# Patient Record
Sex: Male | Born: 1995 | Race: Black or African American | Hispanic: No | Marital: Single | Smoking: Never smoker
Health system: Southern US, Community
[De-identification: ages and names within clinical notes are randomized; demographics above are authoritative.]

## PROBLEM LIST (undated history)

## (undated) ENCOUNTER — Emergency Department (HOSPITAL_BASED_OUTPATIENT_CLINIC_OR_DEPARTMENT_OTHER): Admission: EM | Payer: Medicaid Other | Source: Home / Self Care

---

## 2014-07-04 ENCOUNTER — Other Ambulatory Visit: Payer: Self-pay | Admitting: Orthopedic Surgery

## 2014-07-04 DIAGNOSIS — M25562 Pain in left knee: Secondary | ICD-10-CM

## 2014-07-05 ENCOUNTER — Ambulatory Visit
Admission: RE | Admit: 2014-07-05 | Discharge: 2014-07-05 | Disposition: A | Discharge status: Home / Self Care | Payer: PRIVATE HEALTH INSURANCE | Source: Ambulatory Visit | Attending: Orthopedic Surgery | Admitting: Orthopedic Surgery

## 2014-07-05 DIAGNOSIS — M25562 Pain in left knee: Secondary | ICD-10-CM

## 2014-10-14 ENCOUNTER — Emergency Department (HOSPITAL_BASED_OUTPATIENT_CLINIC_OR_DEPARTMENT_OTHER)
Admission: EM | Admit: 2014-10-14 | Discharge: 2014-10-14 | Disposition: A | Discharge status: Home / Self Care | Payer: Medicaid Other | Attending: Emergency Medicine | Admitting: Emergency Medicine

## 2014-10-14 ENCOUNTER — Encounter (HOSPITAL_BASED_OUTPATIENT_CLINIC_OR_DEPARTMENT_OTHER): Payer: Self-pay | Admitting: Emergency Medicine

## 2014-10-14 DIAGNOSIS — Z79899 Other long term (current) drug therapy: Secondary | ICD-10-CM | POA: Insufficient documentation

## 2014-10-14 DIAGNOSIS — A64 Unspecified sexually transmitted disease: Secondary | ICD-10-CM | POA: Diagnosis not present

## 2014-10-14 DIAGNOSIS — Z202 Contact with and (suspected) exposure to infections with a predominantly sexual mode of transmission: Secondary | ICD-10-CM | POA: Diagnosis present

## 2014-10-14 DIAGNOSIS — Z113 Encounter for screening for infections with a predominantly sexual mode of transmission: Secondary | ICD-10-CM

## 2014-10-14 NOTE — ED Notes (Signed)
Patient states that his friend had and STD so he thought he would come and get checked. Patient has not been exposed nor does he have and reported S/S however he has never been test and states "I thought I should get checked"

## 2014-10-14 NOTE — ED Provider Notes (Signed)
CSN: [id number]     Arrival date & time 10/14/14  1453 History   First MD Initiated Contact with Patient 10/14/14 1505     Chief Complaint  Patient presents with  . SEXUALLY TRANSMITTED DISEASE     (Consider location/radiation/quality/duration/timing/severity/associated sxs/prior Treatment) HPI Comments: Patient is an 18 year old male who presents to the ED for STD check. Patient reports one of his friends was recently diagnosed with an STD so he thought he should get checked. Patient reports unprotected sexual encounters recently. Patient denies any symptoms.    History reviewed. No pertinent past medical history. History reviewed. No pertinent past surgical history. History reviewed. No pertinent family history. History  Substance Use Topics  . Smoking status: Never Smoker   . Smokeless tobacco: Not on file  . Alcohol Use: No    Review of Systems  Constitutional:       STD check  All other systems reviewed and are negative.     Allergies  Review of patient's allergies indicates no known allergies.  Home Medications   Prior to Admission medications   Medication Sig Start Date End Date Taking? Authorizing Provider  methylphenidate (RITALIN LA) 10 MG 24 hr capsule Take 10 mg by mouth daily.   Yes Historical Provider, MD   BP 142/40  Pulse 48  Temp(Src) 98.4 F (36.9 C) (Oral)  Resp 16  Ht 6\' 8"  (2.032 m)  Wt 197 lb (89.359 kg)  BMI 21.64 kg/m2  SpO2 100% Physical Exam  Nursing note and vitals reviewed. Constitutional: He appears well-developed and well-nourished. No distress.  HENT:  Head: Normocephalic and atraumatic.  Eyes: Conjunctivae are normal.  Neck: Normal range of motion.  Cardiovascular: Normal rate and regular rhythm.  Exam reveals no gallop and no friction rub.   No murmur heard. Pulmonary/Chest: Effort normal and breath sounds normal. He has no wheezes. He has no rales. He exhibits no tenderness.  Abdominal: Soft. There is no tenderness.   Genitourinary: Penis normal.  No penile discharge noted.   Musculoskeletal: Normal range of motion.  Neurological: He is alert.  Speech is goal-oriented. Moves limbs without ataxia.   Skin: Skin is warm and dry.  Psychiatric: He has a normal mood and affect. His behavior is normal.    ED Course  Procedures (including critical care time) Labs Review Labs Reviewed - No data to display  Imaging Review No results found.   EKG Interpretation None      MDM   Final diagnoses:  Screening examination for STD (sexually transmitted disease)    4:05 PM Patient swabbed and will be contacted if results positive. No further evaluation needed at this time.   Emilia BeckKaitlyn Anushri Casalino, PA-C 10/14/14 1616  Rolland PorterMark Kippy, MD 10/20/14 [phone]

## 2014-10-14 NOTE — Discharge Instructions (Signed)
You will be contacted in 48 hours if your results are positive. Refer to attached documents for more information.

## 2014-10-16 ENCOUNTER — Telehealth (HOSPITAL_COMMUNITY): Payer: Self-pay | Admitting: *Deleted

## 2014-10-16 ENCOUNTER — Telehealth (HOSPITAL_BASED_OUTPATIENT_CLINIC_OR_DEPARTMENT_OTHER): Payer: Self-pay | Admitting: Emergency Medicine

## 2014-10-16 NOTE — ED Notes (Signed)
Pt. called [facility] for his lab results from 10/31.  I accessed his chart and told him he did not have any tests pending that I can see. I then noted he was seen at [facility] and gave him that number to call. Vassie MoselleYork, Yazan Gatling M 10/16/2014

## 2014-10-19 ENCOUNTER — Emergency Department (HOSPITAL_BASED_OUTPATIENT_CLINIC_OR_DEPARTMENT_OTHER)
Admission: EM | Admit: 2014-10-19 | Discharge: 2014-10-19 | Discharge status: Against Medical Advice | Payer: Medicaid Other

## 2014-10-20 LAB — GC/CHLAMYDIA PROBE AMP
CT Probe RNA: POSITIVE — AB
GC PROBE AMP APTIMA: NEGATIVE

## 2014-10-23 ENCOUNTER — Telehealth (HOSPITAL_COMMUNITY): Payer: Self-pay

## 2014-10-23 NOTE — ED Notes (Signed)
Informed of lab results. Advised to notify partner(s) and abstain from sexual activity x 10 days. Cefixime 400mg  po x 1 and zithromycin 1 gram x 1 per Blondell RevealM Gentry [facility] [address]  [phone]. Left on voicemail.

## 2014-10-23 NOTE — ED Notes (Signed)
Chart has returned from EDP office. Attempted call x 1. 

## 2016-02-09 ENCOUNTER — Ambulatory Visit (INDEPENDENT_AMBULATORY_CARE_PROVIDER_SITE_OTHER): Payer: Commercial Managed Care - PPO | Admitting: Urgent Care

## 2016-02-09 VITALS — BP 110/64 | HR 70 | Temp 98.3°F | Resp 12 | Ht >= 80 in | Wt 196.0 lb

## 2016-02-09 DIAGNOSIS — R112 Nausea with vomiting, unspecified: Secondary | ICD-10-CM | POA: Diagnosis not present

## 2016-02-09 DIAGNOSIS — K30 Functional dyspepsia: Secondary | ICD-10-CM | POA: Diagnosis not present

## 2016-02-09 DIAGNOSIS — K529 Noninfective gastroenteritis and colitis, unspecified: Secondary | ICD-10-CM | POA: Diagnosis not present

## 2016-02-09 DIAGNOSIS — E860 Dehydration: Secondary | ICD-10-CM | POA: Diagnosis not present

## 2016-02-09 DIAGNOSIS — R109 Unspecified abdominal pain: Secondary | ICD-10-CM

## 2016-02-09 MED ORDER — ONDANSETRON 4 MG PO TBDP: 4.0000 mg | ORAL_TABLET | Freq: Three times a day (TID) | ORAL | Status: AC | PRN

## 2016-02-09 MED ORDER — ONDANSETRON 4 MG PO TBDP
4.0000 mg | ORAL_TABLET | Freq: Once | ORAL | Status: AC
  Administered 2016-02-09: 4 mg via ORAL

## 2016-02-09 NOTE — Progress Notes (Signed)
    MRN: [id number] DOB: [birth year]  Subjective:   Harry Adams is a 20 y.o. male presenting for chief complaint of Emesis; Diarrhea; and Abdominal Pain  Reports 1 day history of nausea with vomiting x4. Patient has tried to stay hydrated but keeps throwing up water that he drinks. He has also had belly pain, chills, subjective fever. Has tried Pepto Bismol and ginger ale with minimal relief. Of note, patient is on a basketball team for [employer], multiple teammates have had similar symptoms and are getting treatment for gastroenteritis. Patient also has a Designer, jewellery and plans on playing. Denies smoking cigarettes or drinking alcohol.  Harry Adams currently has no medications in their medication list. Also is allergic to shellfish allergy.  Harry Adams  has no past medical history on file. Also  has no past surgical history on file.  Family history is negative for heart disease, lung disease.  Objective:   Vitals: BP 110/64 mmHg  Pulse 70  Temp(Src) 98.3 F (36.8 C) (Oral)  Resp 12  Ht  (2.057 m)  Wt 196 lb (88.905 kg)  BMI 21.01 kg/m2  SpO2 98%  Orthostatic VS for the past 24 hrs:  BP- Lying Pulse- Lying BP- Sitting Pulse- Sitting BP- Standing at 0 minutes Pulse- Standing at 0 minutes  02/09/16 1153 111/69 mmHg 52 108/67 mmHg 58 129/73 mmHg 69  02/09/16 1042 110/67 mmHg (!) 47 115/72 mmHg (!) 48 107/68 mmHg 93   Physical Exam  Constitutional: He is oriented to person, place, and time. He appears well-developed and well-nourished.  HENT:  Mucous membranes are dry.  Eyes: No scleral icterus.  Cardiovascular: Normal rate, regular rhythm and intact distal pulses.  Exam reveals no gallop and no friction rub.   No murmur heard. Pulmonary/Chest: No respiratory distress. He has no wheezes. He has no rales.  Abdominal: Soft. Bowel sounds are normal. He exhibits no distension and no mass. There is no tenderness.  Neurological: He is alert and oriented to person, place, and time.  Skin: Skin  is warm and dry.   Assessment and Plan :   1. Gastroenteritis 2. Nausea and vomiting, intractability of vomiting not specified, unspecified vomiting type 3. Belly pain 4. Dehydration - Advised supportive care. Anticipatory guidance provided. RTC if no improvement in 1 week.  Wallis Bamberg, PA-C [facility] [phone] 02/09/2016 10:32 AM

## 2016-02-09 NOTE — Patient Instructions (Addendum)
I recommend patient rest and hydrate well, take 2-3 days off from basketball.   Viral Gastroenteritis Viral gastroenteritis is also known as stomach flu. This condition affects the stomach and intestinal tract. It can cause sudden diarrhea and vomiting. The illness typically lasts 3 to 8 days. Most people develop an immune response that eventually gets rid of the virus. While this natural response develops, the virus can make you quite ill. CAUSES  Many different viruses can cause gastroenteritis, such as rotavirus or noroviruses. You can catch one of these viruses by consuming contaminated food or water. You may also catch a virus by sharing utensils or other personal items with an infected person or by touching a contaminated surface. SYMPTOMS  The most common symptoms are diarrhea and vomiting. These problems can cause a severe loss of body fluids (dehydration) and a body salt (electrolyte) imbalance. Other symptoms may include:  Fever.  Headache.  Fatigue.  Abdominal pain. DIAGNOSIS  Your caregiver can usually diagnose viral gastroenteritis based on your symptoms and a physical exam. A stool sample may also be taken to test for the presence of viruses or other infections. TREATMENT  This illness typically goes away on its own. Treatments are aimed at rehydration. The most serious cases of viral gastroenteritis involve vomiting so severely that you are not able to keep fluids down. In these cases, fluids must be given through an intravenous line (IV). HOME CARE INSTRUCTIONS   Drink enough fluids to keep your urine clear or pale yellow. Drink small amounts of fluids frequently and increase the amounts as tolerated.  Ask your caregiver for specific rehydration instructions.  Avoid:  Foods high in sugar.  Alcohol.  Carbonated drinks.  Tobacco.  Juice.  Caffeine drinks.  Extremely hot or cold fluids.  Fatty, greasy foods.  Too much intake of anything at one time.  Dairy  products until 24 to 48 hours after diarrhea stops.  You may consume probiotics. Probiotics are active cultures of beneficial bacteria. They may lessen the amount and number of diarrheal stools in adults. Probiotics can be found in yogurt with active cultures and in supplements.  Wash your hands well to avoid spreading the virus.  Only take over-the-counter or prescription medicines for pain, discomfort, or fever as directed by your caregiver. Do not give aspirin to children. Antidiarrheal medicines are not recommended.  Ask your caregiver if you should continue to take your regular prescribed and over-the-counter medicines.  Keep all follow-up appointments as directed by your caregiver. SEEK IMMEDIATE MEDICAL CARE IF:   You are unable to keep fluids down.  You do not urinate at least once every 6 to 8 hours.  You develop shortness of breath.  You notice blood in your stool or vomit. This may look like coffee grounds.  You have abdominal pain that increases or is concentrated in one small area (localized).  You have persistent vomiting or diarrhea.  You have a fever.  The patient is a child younger than 3 months, and he or she has a fever.  The patient is a child older than 3 months, and he or she has a fever and persistent symptoms.  The patient is a child older than 3 months, and he or she has a fever and symptoms suddenly get worse.  The patient is a baby, and he or she has no tears when crying. MAKE SURE YOU:   Understand these instructions.  Will watch your condition.  Will get help right away if you are  not doing well or get worse.   This information is not intended to replace advice given to you by your health care provider. Make sure you discuss any questions you have with your health care provider.   Document Released: 12/01/2005 Document Revised: 02/23/2012 Document Reviewed: 09/17/2011 Elsevier Interactive Patient Education [employer]

## 2017-09-26 ENCOUNTER — Ambulatory Visit (INDEPENDENT_AMBULATORY_CARE_PROVIDER_SITE_OTHER): Payer: Self-pay | Admitting: Orthopedic Surgery

## 2017-11-17 ENCOUNTER — Encounter (INDEPENDENT_AMBULATORY_CARE_PROVIDER_SITE_OTHER): Payer: Self-pay | Admitting: Orthopedic Surgery

## 2017-11-17 ENCOUNTER — Ambulatory Visit (INDEPENDENT_AMBULATORY_CARE_PROVIDER_SITE_OTHER): Payer: PRIVATE HEALTH INSURANCE

## 2017-11-17 ENCOUNTER — Ambulatory Visit (HOSPITAL_COMMUNITY)
Admission: RE | Admit: 2017-11-17 | Discharge: 2017-11-17 | Disposition: A | Discharge status: Home / Self Care | Payer: Commercial Managed Care - PPO | Source: Ambulatory Visit | Attending: Orthopedic Surgery | Admitting: Orthopedic Surgery

## 2017-11-17 ENCOUNTER — Ambulatory Visit (INDEPENDENT_AMBULATORY_CARE_PROVIDER_SITE_OTHER): Payer: Commercial Managed Care - PPO | Admitting: Orthopedic Surgery

## 2017-11-17 VITALS — Ht >= 80 in | Wt 196.0 lb

## 2017-11-17 DIAGNOSIS — S93622A Sprain of tarsometatarsal ligament of left foot, initial encounter: Secondary | ICD-10-CM

## 2017-11-17 DIAGNOSIS — M79671 Pain in right foot: Secondary | ICD-10-CM | POA: Diagnosis not present

## 2017-11-17 DIAGNOSIS — R600 Localized edema: Secondary | ICD-10-CM | POA: Diagnosis not present

## 2017-11-17 DIAGNOSIS — S93492A Sprain of other ligament of left ankle, initial encounter: Secondary | ICD-10-CM | POA: Insufficient documentation

## 2017-11-17 DIAGNOSIS — M79672 Pain in left foot: Secondary | ICD-10-CM | POA: Diagnosis not present

## 2017-11-17 DIAGNOSIS — X58XXXA Exposure to other specified factors, initial encounter: Secondary | ICD-10-CM | POA: Diagnosis not present

## 2017-11-17 DIAGNOSIS — S9032XA Contusion of left foot, initial encounter: Secondary | ICD-10-CM | POA: Insufficient documentation

## 2017-11-17 NOTE — Progress Notes (Signed)
   Office Visit Note   Patient: Harry SchimkeJames L Franek           Date of Birth: 05-19-1996           MRN: [id number] Visit Date: 11/17/2017              Requested by: No referring provider defined for this encounter. PCP: Patient, No Pcp Per  Chief Complaint  Patient presents with  . 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  He was able to continue playing for about 15 minutes currently is on crutches nonweightbearing.  Assessment & Plan: Visit Diagnoses:  1. Lisfranc's sprain, left, initial encounter   2. Pain in right foot   3. Pain in left foot     Plan: Patient is tender to palpation across the Lisfranc complex distraction is also tender.  Patient may have a sprain.  Will request an MRI scan will continue the fracture boot with nonweightbearing.  Follow-Up Instructions: Return if symptoms worsen or fail to improve.   Ortho Exam  Patient is alert, oriented, no adenopathy, well-dressed, normal affect, normal respiratory effort. Patient is currently ambulating with crutches.  He has a good dorsalis pedis and posterior tibial pulse.  He does have a tight heel cord with dorsiflexion to neutral.  Patient is tender to palpation across the Lisfranc complex, AP motion of the metatarsal heads does not reproduce pain at the base of the first or second metatarsal however valgus distraction does produce pain to the base of the first metatarsal.  There is minimal swelling there is no bruising or ecchymosis.  Imaging: Xr Foot Complete Left  Result Date: 11/17/2017 2 view radiographs of the left foot including standing weightbearing radiographs shows no widening across the Lisfranc complex no difference from the left to the right side.  Xr Foot Complete Right  Result Date: 11/17/2017 2 view radiographs of the right foot including standing shows no evidence of  widening across the Lisfranc complex.  No images are attached to the encounter.  Labs: No results found for: HGBA1C, ESRSEDRATE, CRP, LABURIC, REPTSTATUS, GRAMSTAIN, CULT, LABORGA  @LABSALLVALUES (HGBA1)@  Body mass index is 21 kg/m.  Orders:  Orders Placed This Encounter  Procedures  . XR Foot Complete Left  . XR Foot Complete Right  . MR Foot Left w/o contrast   No orders of the defined types were placed in this encounter.    Procedures: No procedures performed  Clinical Data: No additional findings.  ROS:  All other systems negative, except as noted in the HPI. Review of Systems  Objective: Vital Signs: Ht 6\' 9"  (2.057 m)   Wt 196 lb (88.9 kg)   BMI 21.00 kg/m   Specialty Comments:  No specialty comments available.  PMFS History: There are no active problems to display for this patient.  History reviewed. No pertinent past medical history.  History reviewed. No pertinent family history.  History reviewed. No pertinent surgical history. Social History   Occupational History  . Not on file  Tobacco Use  . Smoking status: Never Smoker  Substance and Sexual Activity  . Alcohol use: No  . Drug use: No  . Sexual activity: Yes    Birth control/protection: Condom

## 2017-11-27 ENCOUNTER — Telehealth: Payer: Self-pay | Admitting: Sports Medicine

## 2017-11-27 ENCOUNTER — Ambulatory Visit: Payer: Self-pay | Admitting: Sports Medicine

## 2017-11-27 NOTE — Telephone Encounter (Signed)
Patient has been dealing with a Lisfranc injury over the past several weeks.  He is currently in a boot for approximately 2 more weeks.  After this has been recommended that he be placed into custom orthotics for his return to basketball.  Given the size he needs, I do not have these readily available at my office and will get him set up to have these fabricated [facility].  In addition to seeing Dr. due to he has been [facility] but these notes are not available at this time.  I would like for him to be in a standard FASTEC orthotic and not the smart so that the athletic training room has available given the need for increased midfoot stability.   This has been communicated with the athletic training staff and in the sports medicine center is coordinating scheduling this with the patient directly.

## 2017-11-30 ENCOUNTER — Ambulatory Visit (INDEPENDENT_AMBULATORY_CARE_PROVIDER_SITE_OTHER): Payer: Commercial Managed Care - PPO | Admitting: Sports Medicine

## 2017-11-30 ENCOUNTER — Encounter: Payer: Self-pay | Admitting: Sports Medicine

## 2017-11-30 VITALS — BP 108/70 | Ht >= 80 in | Wt 205.0 lb

## 2017-11-30 DIAGNOSIS — S93622A Sprain of tarsometatarsal ligament of left foot, initial encounter: Secondary | ICD-10-CM

## 2017-11-30 NOTE — Progress Notes (Signed)
  Patient comes in today at the request of Dr. Berline Choughigby for custom orthotics. He has a Lisfranc injury to his left foot. He has had 2 surgical opinions but the surgeons are wanting to avoid surgery if possible. He has been in a [employer] systems [employer] for the past few weeks. He is here today with his athletic trainer.  Examination of his feet in the standing position shows marked pes planus. He is tender to palpation at the Lisfranc joint. No soft tissue swelling. No ecchymosis. Normal gait.  Custom orthotics were created today. Patient found them to be comfortable prior to leaving the office. He will follow-up with Dr. Berline Choughigby and Dr. Lajoyce Cornersuda at their discretion and will follow-up with me as needed.  Total time spent with the patient was 30 minutes with greater than 50% of the time spent in face-to-face consultation discussing orthotic construction, instruction, and fitting.  Patient was fitted for a : standard, cushioned, semi-rigid orthotic. The orthotic was heated and afterward the patient stood on the orthotic blank positioned on the orthotic stand. The patient was positioned in subtalar neutral position and 10 degrees of ankle dorsiflexion in a weight bearing stance. After completion of molding, a stable base was applied to the orthotic blank. The blank was ground to a stable position for weight bearing. Size: 16 Base: Blue EVA Posting: none Additional orthotic padding: none

## 2017-12-14 ENCOUNTER — Ambulatory Visit (INDEPENDENT_AMBULATORY_CARE_PROVIDER_SITE_OTHER): Payer: Self-pay | Admitting: Orthopedic Surgery

## 2017-12-14 DIAGNOSIS — M79671 Pain in right foot: Secondary | ICD-10-CM

## 2018-01-04 ENCOUNTER — Encounter (INDEPENDENT_AMBULATORY_CARE_PROVIDER_SITE_OTHER): Payer: Self-pay | Admitting: Orthopedic Surgery

## 2018-01-04 NOTE — Progress Notes (Signed)
   Post-Op Visit Note   Patient: Harry Adams           Date of Birth: 05/19/1996           MRN: [id number] Visit Date: 12/14/2017 PCP: Patient, No Pcp Per   Assessment & Plan:  Chief Complaint: No chief complaint on file.  Visit Diagnoses:  1. Pain in right foot     Plan: Patient was seen on 11/16/2017.  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  He jumped up and landed on someone's foot.  He was able to finish the game.  He reports difficulty weightbearing since the day after the game.  On examination he does have an antalgic gait to the left.  Does have some midfoot swelling.  Not much in the way of pain with pronation supination of the foot.  Patient has palpable intact nontender anterior to posterior to peroneal and Achilles tendons.  Impression is left foot midfoot strain with normal radiographs.  We need to have some weightbearing radiographs made so we can make sure that there is no ligamentous injury.  I think it is unlikely based on the fact that he was able to complete the game that he has Lisfranc ligament injury.  Nonetheless I like for him to see Dr. Lajoyce Cornersuda tomorrow for weightbearing radiographs and examination.  He may need further imaging. Follow-Up Instructions: Return if symptoms worsen or fail to improve.   Orders:  No orders of the defined types were placed in this encounter.  No orders of the defined types were placed in this encounter.   Imaging: No results found.  PMFS History: There are no active problems to display for this patient.  History reviewed. No pertinent past medical history.  History reviewed. No pertinent family history.  History reviewed. No pertinent surgical history. Social History   Occupational History  . Not on file  Tobacco Use  . Smoking status: Never Smoker  . Smokeless tobacco: Never Used  Substance and Sexual Activity  . Alcohol use: No  . Drug use: No  . Sexual activity: Yes    Birth  control/protection: Condom

## 2018-12-02 ENCOUNTER — Encounter: Payer: Self-pay | Admitting: Family Medicine

## 2018-12-02 ENCOUNTER — Ambulatory Visit (INDEPENDENT_AMBULATORY_CARE_PROVIDER_SITE_OTHER): Payer: 59 | Admitting: Family Medicine

## 2018-12-02 VITALS — BP 118/74 | HR 57 | Temp 98.0°F | Ht >= 80 in | Wt 214.8 lb

## 2018-12-02 DIAGNOSIS — S0083XA Contusion of other part of head, initial encounter: Secondary | ICD-10-CM | POA: Diagnosis not present

## 2018-12-02 NOTE — Progress Notes (Signed)
   Subjective:    Patient ID: Harry Adams, male    DOB: 02-06-1996, 22 y.o.   MRN: [id number]  HPI Last evening while playing basketball he sustained an injury to the right medial zygomatic arch area.  He was hit by an elbow.  He did not lose consciousness.  He is able to continue to play.  He does complain of upper right-sided tooth numbness as well as numbness to the upper lip.   Review of Systems     Objective:   Physical Exam Alert and in no distress.  EOMI.  Slight tenderness to palpation over the medial right zygomatic arch area.  No ecchymosis or swelling noted.  Teeth are appropriately aligned with good occlusion no laxity noted.       Assessment & Plan:  Facial contusion, initial encounter I explained that I thought this was really minimal trauma and no need for an x-ray.  He was comfortable with this.  We will reevaluate depending on how he does.  Explained that the numbness will eventually go away with time.

## 2018-12-10 ENCOUNTER — Other Ambulatory Visit: Payer: Self-pay | Admitting: Family Medicine

## 2018-12-10 ENCOUNTER — Inpatient Hospital Stay: Admission: RE | Admit: 2018-12-10 | Payer: Self-pay | Source: Ambulatory Visit

## 2018-12-10 ENCOUNTER — Ambulatory Visit
Admission: RE | Admit: 2018-12-10 | Discharge: 2018-12-10 | Disposition: A | Discharge status: Home / Self Care | Payer: Self-pay | Source: Ambulatory Visit | Attending: Family Medicine | Admitting: Family Medicine

## 2018-12-10 ENCOUNTER — Other Ambulatory Visit: Payer: Self-pay

## 2018-12-10 DIAGNOSIS — M26629 Arthralgia of temporomandibular joint, unspecified side: Secondary | ICD-10-CM

## 2018-12-10 DIAGNOSIS — J3489 Other specified disorders of nose and nasal sinuses: Secondary | ICD-10-CM

## 2018-12-29 IMAGING — MR MR FOOT*L* W/O CM
4 of 6 series · 19 of 40 positions shown · non-contrast
Comparison: Plain films left foot this same day.

CLINICAL DATA: Onset of left foot pain 4 days ago when someone
landed on the foot when the patient was playing basketball. Question
fracture or Lisfranc ligament tear.

EXAM:
MRI OF THE LEFT FOOT WITHOUT CONTRAST
TECHNIQUE: Multiplanar, multisequence MR imaging of the left foot was
performed. No intravenous contrast was administered.

[Series 3: T1 · oblique · 4.0mm · 0.29mm/px · 9 of 43 slices shown (1 of 2)]
[im 1/43]
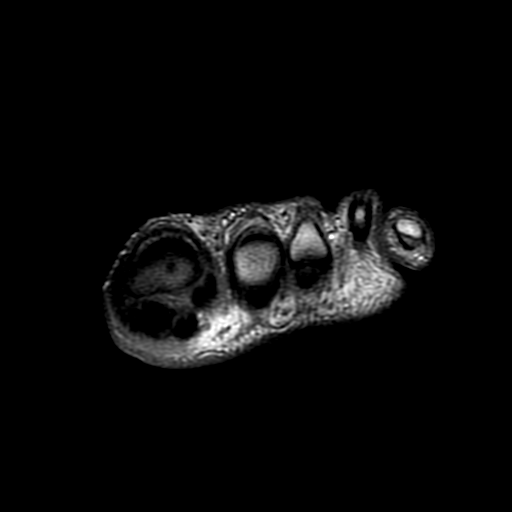
[im 6/43]
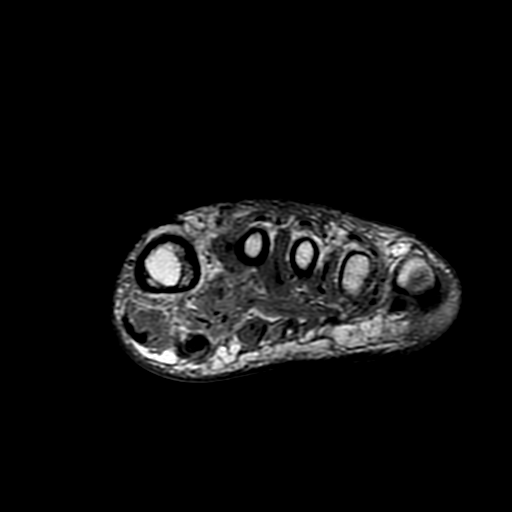
[im 11/43]
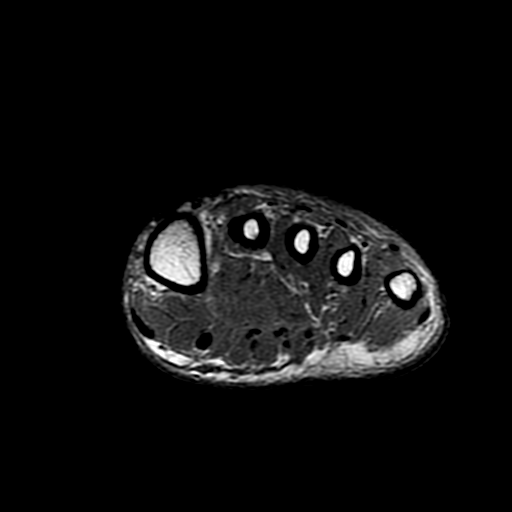
[im 16/43]
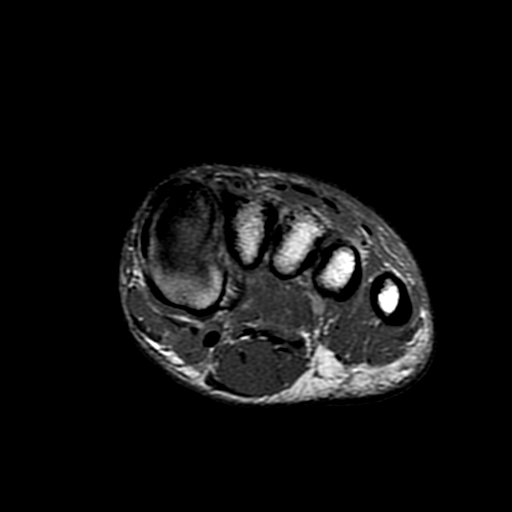
[im 22/43]
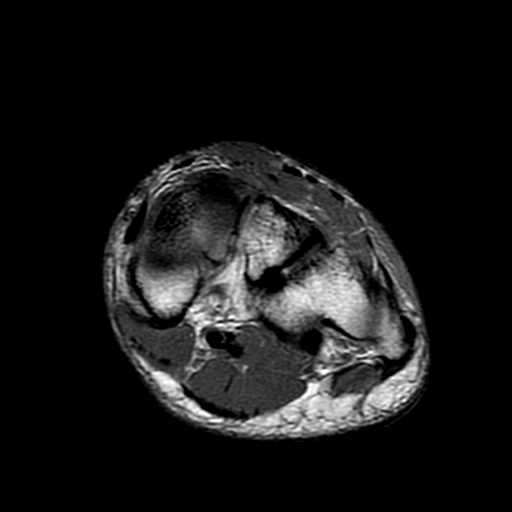
[im 27/43]
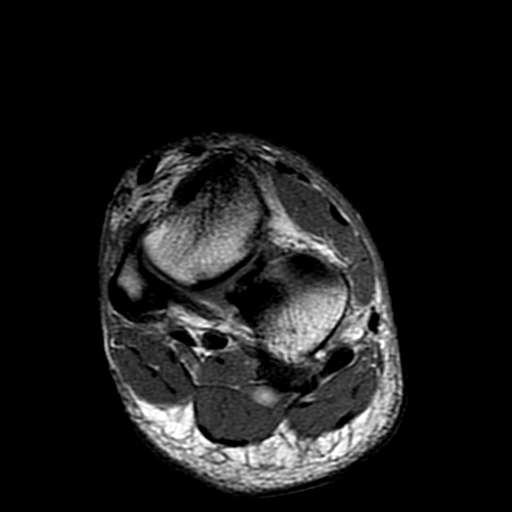
[im 32/43]
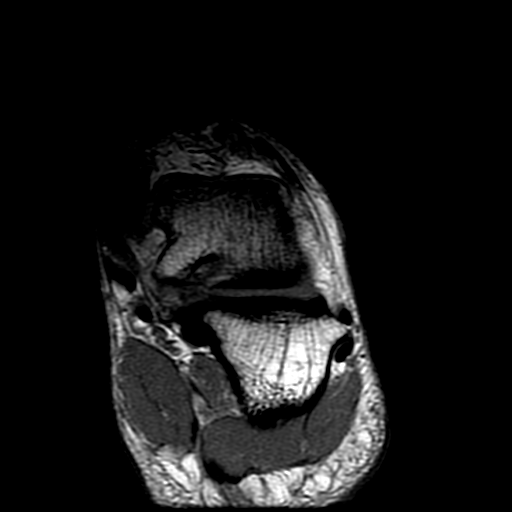
[im 37/43]
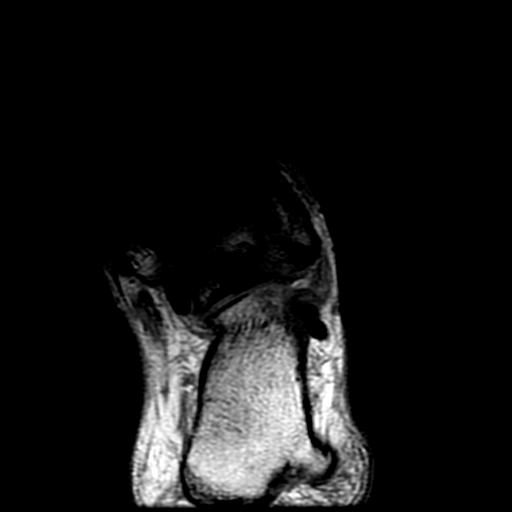
[im 43/43]
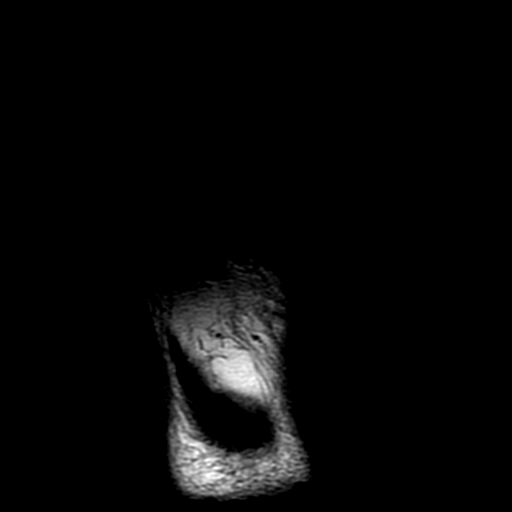

[Series 4: T2 · oblique · 4.0mm · 0.29mm/px · 3 of 43 slices shown (1 of 2)]
[im 5/43]
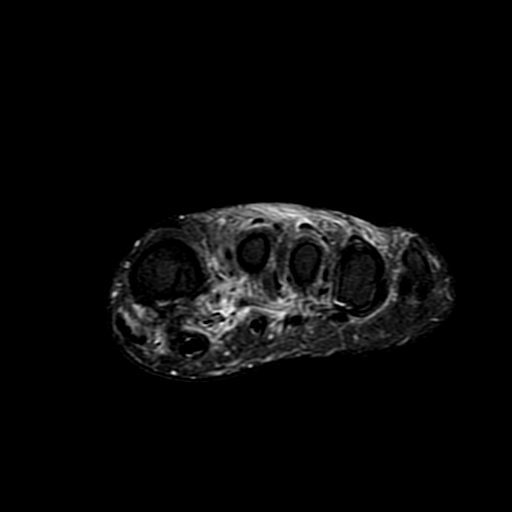
[im 24/43]
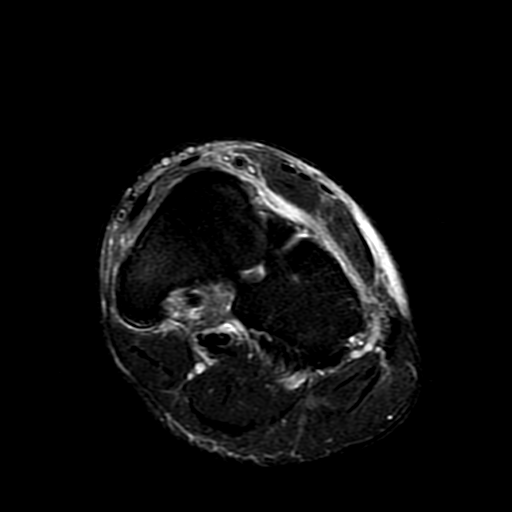
[im 38/43]
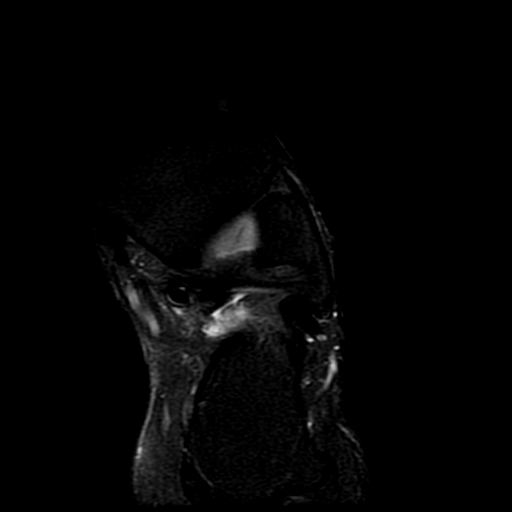

[Series 5: T1 · oblique · 4.0mm · 0.39mm/px · 4 of 26 slices shown (2 of 2)]
[im 1/26]
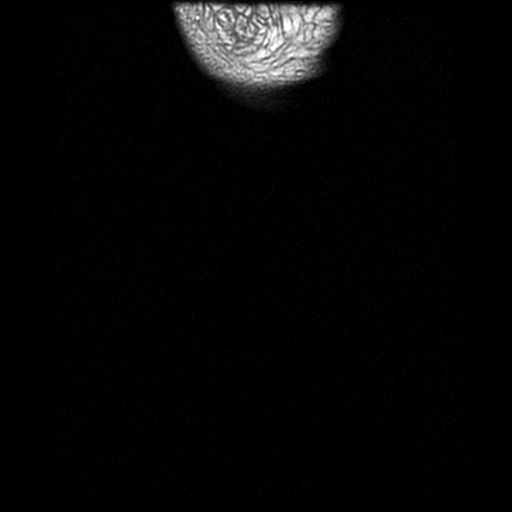
[im 6/26]
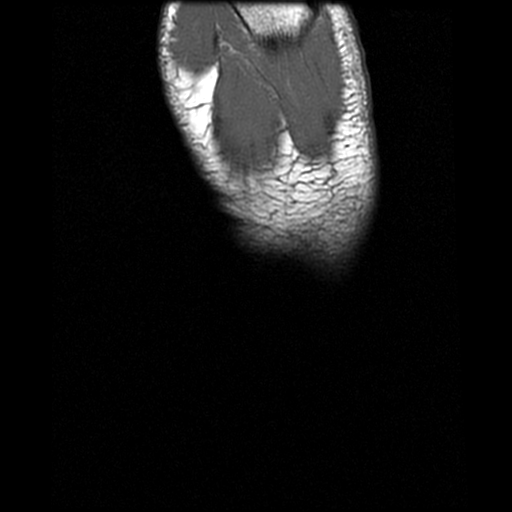
[im 16/26]
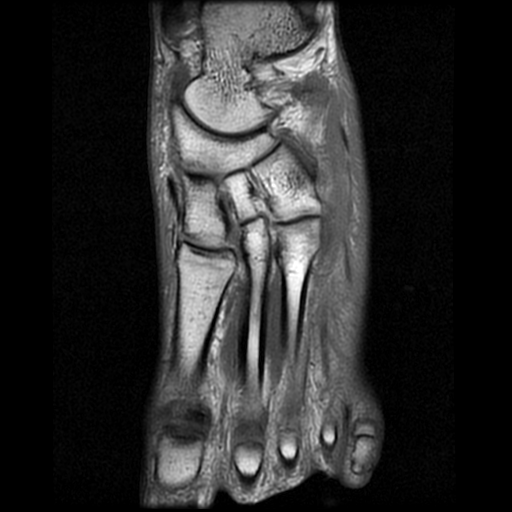
[im 26/26]
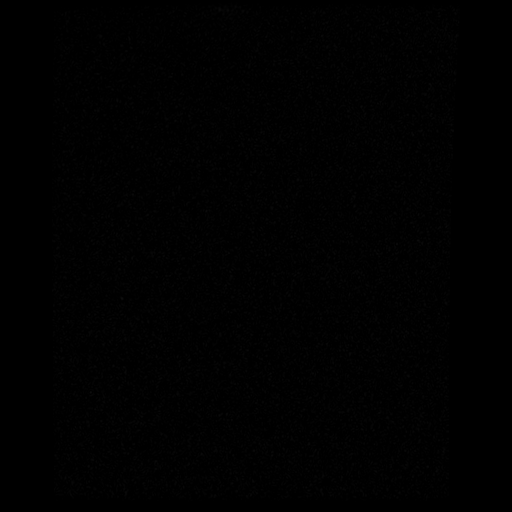

[Series 6: T2 · oblique · 4.0mm · 0.39mm/px · 3 of 26 slices shown (2 of 2)]
[im 6/26]
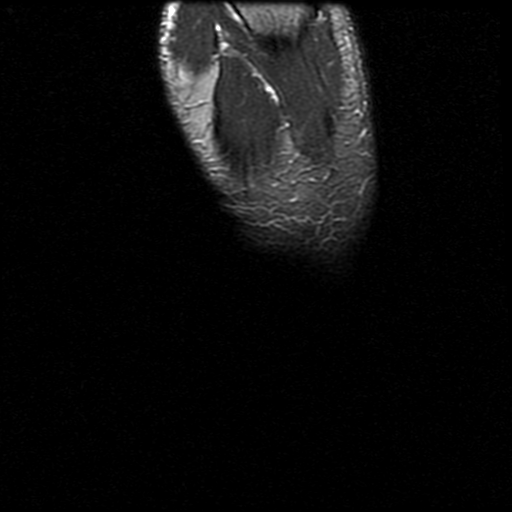
[im 16/26]
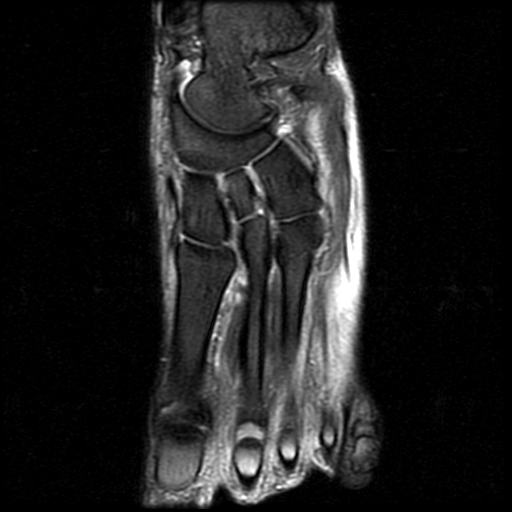
[im 26/26]
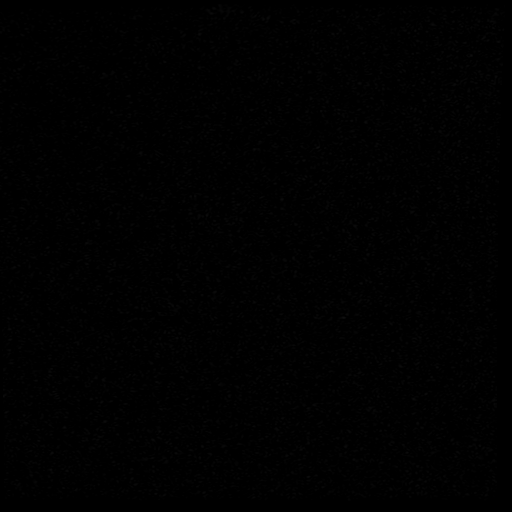

[19 of 40 positions shown; findings below may reference images not displayed]

FINDINGS: Bones/Joint/Cartilage

No fracture is identified. There is marrow edema in the medial
sesamoid bone consistent with sesamoid dysfunction.

Ligaments

The Lisfranc ligament is completely torn.

Muscles and Tendons

There is edema in the abductor hallucis muscle and quadratus plantae
muscle likely due to contusion. No tendon tear is seen.

Soft tissues

Subcutaneous edema is seen over the dorsum of the foot.
IMPRESSION: The examination is positive for Lisfranc ligament tear.

Small foci of contusion in the plantar surfaces of the proximal
first and second metatarsals. No fracture is identified.

Edema in the abductor hallucis and quadratus plantae muscles
compatible with contusion. No muscle tear is seen.

Marrow edema in the medial sesamoid consistent with sesamoid
dysfunction.

## 2019-01-17 ENCOUNTER — Other Ambulatory Visit: Payer: Self-pay | Admitting: Family Medicine

## 2019-01-17 DIAGNOSIS — S0591XA Unspecified injury of right eye and orbit, initial encounter: Secondary | ICD-10-CM

## 2019-01-17 DIAGNOSIS — H532 Diplopia: Secondary | ICD-10-CM

## 2019-01-17 NOTE — Progress Notes (Signed)
He injured his right eye in basketball practice today and is having difficulty with blurred as well as double vision.  He was seen by ophthalmology and a CT scan was recommended.

## 2019-01-18 ENCOUNTER — Ambulatory Visit
Admission: RE | Admit: 2019-01-18 | Discharge: 2019-01-18 | Disposition: A | Discharge status: Home / Self Care | Payer: PRIVATE HEALTH INSURANCE | Source: Ambulatory Visit | Attending: Family Medicine | Admitting: Family Medicine

## 2019-01-18 ENCOUNTER — Other Ambulatory Visit: Payer: Self-pay | Admitting: Family Medicine

## 2019-01-18 DIAGNOSIS — S0591XA Unspecified injury of right eye and orbit, initial encounter: Secondary | ICD-10-CM

## 2019-01-18 DIAGNOSIS — H532 Diplopia: Secondary | ICD-10-CM

## 2019-01-21 ENCOUNTER — Other Ambulatory Visit: Payer: PRIVATE HEALTH INSURANCE

## 2019-01-24 ENCOUNTER — Encounter: Payer: Self-pay | Admitting: Family Medicine
